# Patient Record
Sex: Male | Born: 1995 | Race: White | Hispanic: No | Marital: Single | Smoking: Never smoker
Health system: Southern US, Community
[De-identification: ages and names within clinical notes are randomized; demographics above are authoritative.]

---
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
## 2015-01-14 ENCOUNTER — Encounter: Payer: Self-pay | Admitting: Family Medicine

## 2015-01-14 ENCOUNTER — Ambulatory Visit (INDEPENDENT_AMBULATORY_CARE_PROVIDER_SITE_OTHER): Payer: 59 | Admitting: Family Medicine

## 2015-01-14 VITALS — BP 110/68 | HR 70 | Temp 98.3°F | Ht 74.0 in | Wt 188.0 lb

## 2015-01-14 DIAGNOSIS — Z0000 Encounter for general adult medical examination without abnormal findings: Secondary | ICD-10-CM | POA: Diagnosis not present

## 2015-01-14 LAB — HEPATIC FUNCTION PANEL
ALT: 32 U/L (ref 0–53)
AST: 32 U/L (ref 0–37)
Albumin: 4.4 g/dL (ref 3.5–5.2)
Alkaline Phosphatase: 38 U/L — ABNORMAL LOW (ref 52–171)
BILIRUBIN DIRECT: 0.1 mg/dL (ref 0.0–0.3)
Total Bilirubin: 0.4 mg/dL (ref 0.2–1.2)
Total Protein: 7.1 g/dL (ref 6.0–8.3)

## 2015-01-14 LAB — CBC WITH DIFFERENTIAL/PLATELET
BASOS ABS: 0 10*3/uL (ref 0.0–0.1)
Basophils Relative: 0.4 % (ref 0.0–3.0)
Eosinophils Absolute: 0.2 10*3/uL (ref 0.0–0.7)
Eosinophils Relative: 2.3 % (ref 0.0–5.0)
HEMATOCRIT: 40 % (ref 36.0–49.0)
HEMOGLOBIN: 13.3 g/dL (ref 12.0–16.0)
LYMPHS ABS: 1.9 10*3/uL (ref 0.7–4.0)
Lymphocytes Relative: 20.4 % — ABNORMAL LOW (ref 24.0–48.0)
MCHC: 33.2 g/dL (ref 31.0–37.0)
MCV: 94.7 fl (ref 78.0–98.0)
Monocytes Absolute: 0.7 10*3/uL (ref 0.1–1.0)
Monocytes Relative: 7.1 % (ref 3.0–12.0)
NEUTROS PCT: 69.8 % (ref 43.0–71.0)
Neutro Abs: 6.6 10*3/uL (ref 1.4–7.7)
PLATELETS: 155 10*3/uL (ref 150.0–575.0)
RBC: 4.22 Mil/uL (ref 3.80–5.70)
RDW: 12.5 % (ref 11.4–15.5)
WBC: 9.5 10*3/uL (ref 4.5–13.5)

## 2015-01-14 LAB — TSH: TSH: 1.71 u[IU]/mL (ref 0.40–5.00)

## 2015-01-14 LAB — BASIC METABOLIC PANEL
BUN: 27 mg/dL — ABNORMAL HIGH (ref 6–23)
CALCIUM: 10.1 mg/dL (ref 8.4–10.5)
CO2: 28 mEq/L (ref 19–32)
Chloride: 104 mEq/L (ref 96–112)
Creatinine, Ser: 0.9 mg/dL (ref 0.40–1.50)
GFR: 115.26 mL/min (ref >60.00)
Glucose, Bld: 71 mg/dL (ref 70–99)
POTASSIUM: 4.2 meq/L (ref 3.5–5.1)
SODIUM: 139 meq/L (ref 135–145)

## 2015-01-14 LAB — LIPID PANEL
Cholesterol: 129 mg/dL (ref 0–200)
HDL: 49.4 mg/dL (ref >39.00)
LDL CALC: 71 mg/dL (ref 0–99)
NonHDL: 79.6
TRIGLYCERIDES: 41 mg/dL (ref 0.0–149.0)
Total CHOL/HDL Ratio: 3
VLDL: 8.2 mg/dL (ref 0.0–40.0)

## 2015-01-14 NOTE — Progress Notes (Signed)
Pre visit review using our clinic review tool, if applicable. No additional management support is needed unless otherwise documented below in the visit note. 

## 2015-01-14 NOTE — Progress Notes (Signed)
   Subjective:    Patient ID: George Cowan, male    DOB: 06/12/1996, 19 y.o.   MRN: [id number]  HPI  Patient is here for well visit. He attends [employer]. He has taken some summer classes this summer and also is currently training for EMT certification. 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 Immunizations are up-to-date. He takes Clindagel but not consistently for acne. History of mild intermittent asthma and rarely takes albuterol.  Exercises regularly about 5 days per week. Mild weight change since high school. He states that his weight got down 171 pounds in the first semester of college but he's had some steady weight gain since then. Does combination of cardio and weight training about 5 days per week. Generally feels well with no complaints  No past medical history on file. No past surgical history on file.  reports that he has never smoked. He does not have any smokeless tobacco history on file. His alcohol and drug histories are not on file. family history is not on file. Allergies  Allergen Reactions  . Ampicillin     rash     Review of Systems  Constitutional: Negative for fever, activity change, appetite change and fatigue.  HENT: Negative for congestion, ear pain and trouble swallowing.   Eyes: Negative for pain and visual disturbance.  Respiratory: Negative for cough, shortness of breath and wheezing.   Cardiovascular: Negative for chest pain and palpitations.  Gastrointestinal: Negative for nausea, vomiting, abdominal pain, diarrhea, constipation, blood in stool, abdominal distention and rectal pain.  Genitourinary: Negative for dysuria, hematuria and testicular pain.  Musculoskeletal: Negative for joint swelling and arthralgias.  Skin: Negative for rash.  Neurological: Negative for dizziness, syncope and headaches.  Hematological: Negative for adenopathy.  Psychiatric/Behavioral: Negative for confusion and dysphoric mood.       Objective:   Physical Exam    Constitutional: He is oriented to person, place, and time. He appears well-developed and well-nourished. No distress.  HENT:  Head: Normocephalic and atraumatic.  Right Ear: External ear normal.  Left Ear: External ear normal.  Mouth/Throat: Oropharynx is clear and moist.  Eyes: Conjunctivae and EOM are normal. Pupils are equal, round, and reactive to light.  Neck: Normal range of motion. Neck supple. No thyromegaly present.  Cardiovascular: Normal rate, regular rhythm and normal heart sounds.   No murmur heard. Pulmonary/Chest: No respiratory distress. He has no wheezes. He has no rales.  Abdominal: Soft. Bowel sounds are normal. He exhibits no distension and no mass. There is no tenderness. There is no rebound and no guarding.  Musculoskeletal: He exhibits no edema.  Lymphadenopathy:    He has no cervical adenopathy.  Neurological: He is alert and oriented to person, place, and time. He displays normal reflexes. No cranial nerve deficit.  Skin: No rash noted.  Psychiatric: He has a normal mood and affect.          Assessment & Plan:  Complete physical. Healthy 19 year old male. Obtain screening labs. Immunizations up-to-date. Reminder for yearly flu vaccine.

## 2015-09-12 ENCOUNTER — Encounter: Payer: Self-pay | Admitting: Sports Medicine

## 2015-09-12 ENCOUNTER — Ambulatory Visit (INDEPENDENT_AMBULATORY_CARE_PROVIDER_SITE_OTHER): Payer: 59 | Admitting: Sports Medicine

## 2015-09-12 VITALS — BP 124/51 | HR 63 | Ht 74.0 in | Wt 190.0 lb

## 2015-09-12 DIAGNOSIS — M7541 Impingement syndrome of right shoulder: Secondary | ICD-10-CM

## 2015-09-12 DIAGNOSIS — M25561 Pain in right knee: Secondary | ICD-10-CM | POA: Diagnosis not present

## 2015-09-12 DIAGNOSIS — M25562 Pain in left knee: Secondary | ICD-10-CM

## 2015-09-12 NOTE — Assessment & Plan Note (Signed)
He has hypertrophy on the right side of his upper back as well as scapular protraction Scapular stabilization exercises standard rotator cuff exercises for the supraspinatous  Work on home exercise program If not improving in 6 weeks I will refer him to physical therapy

## 2015-09-12 NOTE — Assessment & Plan Note (Signed)
Today he has pain on the lateral right knee suggestive of iliotibial band syndrome particularly with his gluteus medius weakness His running gait appears normal  Left knee appears to have more patellofemoral symptoms but compression test and other tests are unremarkable weak on his gluteus medius testing  I suspect he gets mechanical problems with his knees fairly quickly because of his hip abduction weakness I suggest a series of hip abduction strengthening exercises Since his joints actually look good I think he should resume running in 3-4 weeks

## 2015-09-12 NOTE — Progress Notes (Signed)
Subjective:  HPI:  George Cowan is a 20 year old previously healthy male who presents with bilateral knee pain and right shoulder pain.  He runs and lifts weights regularly.    His pain in his left knee began approximately 3 months ago.  He states that it feels worse when he is running, especially with impact.  He does not have pain using an elliptical or stationary bike. Pain localizes to the anterior leg just below the patella, and he states that it is slightly tender to touch.  Pain does not radiate. He states that the pain has gotten progressively worse over time, and he has gradually stopped running.   His pain in his right knee began approximately 1 month ago. He states that it hurts lateral to the patella and is tender to touch.  The pain began insidiously and has also worsened over time.   Pain does not radiate.  Pain is worse when squatting.   His pain in his right shoulder began approximately 3 months ago. He states that he has tenderness on his shoulder (right below the acromion) as well as tenderness above his scapula.  Pain has gradually worsened over time.  Pain is worsened by abducting his right arm.  Of note, he was seen for pain in his right knee by Dr. Antoine Primas  on 08/04/2015.  At that time per the note, he had an ultrasound which showed thickening of the superior lateral aspect of the patella which was felt could increase the patient's risk for subluxation of the patella. He was recommended to ice the knee after activity and was given exercises to strengthen the musculature surrounding the patella. He returns today because pain has not improved.  Social history sophomore student at [employer]  Review of systems No locking giving way or swelling in either knee No nighttime pain in his right shoulder  Objective:  Physical exam:   General: Alert, pleasant and talkative. Well-appearing, muscular 20 yo male in no acute distress HEENT: Normocephalic, atraumatic.  Sclera white without injection. Moist mucus membranes Cardiac: normal S1 and S2. Regular rate and rhythm. No murmurs heard on auscultation. Pulmonary: normal work of breathing. Clear to auscultation bilaterally.  Abdomen: soft, nontender, nondistended Extremities: warm and well-perfused; brisk capillary refill Skin: small scabs and scrapes to bilateral anterior legs, no rashes Neuro: no focal deficits   MSK:  Left Knee: Normal to inspection without any erythema or bony abnormalities Non painful patellar compression. Mild point tenderness caudad to patella. Patellar glide without crepitus. Full range of motion of hip and knee. Good strength in quadriceps and hamstring muscles. Weakness on lateral leg raise Negative Mcmurray's test. Negative Lachman's test.    Right Knee:  Normal to inspection without any erythema or bony abnormalities Non painful patellar compression. Mild point tenderness lateral to patella. Patellar glide without crepitus. Full range of motion of hip and knee. Good strength in quadriceps and hamstring muscles. Weakness on lateral leg raise Negative Mcmurray's test. Negative Lachman's test.   Right Shoulder: Inspection revealed enlarged back and shoulder musculature on right compared to left. No erythema or bony abnormalities Point tenderness to palpation between acromion and humerus. Tenderness to palpation along cephalad edge of scapula.  Good biceps, triceps strength.  No pain of arm flexion or extension or adduction, but pain on abduction of arm beyond approx. 60 degrees. Positive empty can test. + Hawkins test.  Labs: Right Knee [place]: No abnormalities of medial or lateral meniscus noted. Normal suprapatellar anatomy. QT/  PT are normal Left Knee US: No abnormalities of medial or lateral meniscus noted. Normal suprapatellar anatomy. QT/PT are normal   Assessment: Wilber OliphantCaleb is a 20 year old previously healthy male who presents with bilateral knee pain and right  shoulder pain. Normal knee anatomy revealed by US.  Exam reveals bilaterally weakness on lateral leg raise consistent with weak gluteal medius muscles.  Exam revealed positive empty can test for right shoulder in addition to increased musculature of right back and shoulder compared to left, consistent with supraspinatus impingement.  Likely due to weight lifting without appropriate balance of exercises.   Plan:  Bilateral Knee pain: - Exercises recommended including lateral leg raises and lateral steps  Right Shoulder pain: Impingement syndrome - Exercises recommended.  -Return to clinic as needed  Glennon HamiltonAmber Marzetta Lanza, MD [facility] PGY-1 09/12/2015  Agree with assessment.  Examined and evaluated by me during course of visit.  Sterling BigKB Fields, MD

## 2015-09-18 ENCOUNTER — Encounter: Payer: Self-pay | Admitting: Family Medicine

## 2015-09-19 ENCOUNTER — Other Ambulatory Visit: Payer: Self-pay | Admitting: Family Medicine

## 2015-09-19 MED ORDER — CLINDAMYCIN PHOSPHATE 1 % EX GEL: Freq: Two times a day (BID) | CUTANEOUS | Status: AC

## 2016-01-16 DIAGNOSIS — M25511 Pain in right shoulder: Secondary | ICD-10-CM | POA: Diagnosis not present

## 2016-01-22 ENCOUNTER — Other Ambulatory Visit: Payer: Self-pay | Admitting: Orthopedic Surgery

## 2016-01-22 DIAGNOSIS — M25511 Pain in right shoulder: Secondary | ICD-10-CM

## 2016-01-24 DIAGNOSIS — R531 Weakness: Secondary | ICD-10-CM | POA: Diagnosis not present

## 2016-02-04 ENCOUNTER — Ambulatory Visit
Admission: RE | Admit: 2016-02-04 | Discharge: 2016-02-04 | Disposition: A | Discharge status: Home / Self Care | Payer: 59 | Source: Ambulatory Visit | Attending: Orthopedic Surgery | Admitting: Orthopedic Surgery

## 2016-02-04 DIAGNOSIS — M25511 Pain in right shoulder: Secondary | ICD-10-CM

## 2016-02-04 DIAGNOSIS — M7581 Other shoulder lesions, right shoulder: Secondary | ICD-10-CM | POA: Diagnosis not present

## 2016-02-04 MED ORDER — IOPAMIDOL (ISOVUE-M 200) INJECTION 41%: 15.0000 mL | Freq: Once | INTRAMUSCULAR | Status: DC

## 2016-02-17 DIAGNOSIS — M25511 Pain in right shoulder: Secondary | ICD-10-CM | POA: Diagnosis not present

## 2016-02-17 DIAGNOSIS — R531 Weakness: Secondary | ICD-10-CM | POA: Diagnosis not present

## 2016-04-02 DIAGNOSIS — Z23 Encounter for immunization: Secondary | ICD-10-CM | POA: Diagnosis not present

## 2016-05-18 ENCOUNTER — Ambulatory Visit (INDEPENDENT_AMBULATORY_CARE_PROVIDER_SITE_OTHER): Payer: Self-pay | Admitting: Orthopedic Surgery

## 2016-05-18 DIAGNOSIS — H52222 Regular astigmatism, left eye: Secondary | ICD-10-CM | POA: Diagnosis not present

## 2016-05-19 DIAGNOSIS — L812 Freckles: Secondary | ICD-10-CM | POA: Diagnosis not present

## 2016-05-19 DIAGNOSIS — L2081 Atopic neurodermatitis: Secondary | ICD-10-CM | POA: Diagnosis not present

## 2016-05-19 DIAGNOSIS — L700 Acne vulgaris: Secondary | ICD-10-CM | POA: Diagnosis not present

## 2018-05-06 IMAGING — MR MR SHOULDER*R* W/CM
6 series · 40 of 40 positions shown · IV contrast (agent unspecified)
Comparison: Injection image same date.

CLINICAL DATA: Right shoulder pain radiating into the scapular
region for 6 or 7 months. Weight lifter with hand weakness. No acute
injury or prior relevant surgery.

EXAM:
MR ARTHROGRAM OF THE RIGHT SHOULDER
TECHNIQUE: Multiplanar, multisequence MR imaging of the right shoulder was
performed following the administration of intra-articular contrast.
CONTRAST:  See Injection Documentation.

[Series 3: T1 fat-sat · axial · 4.0mm · 0.23mm/px · z∈[-32,+63]mm · 8 of 21 slices shown (1 of 4)]
[im 1/21]
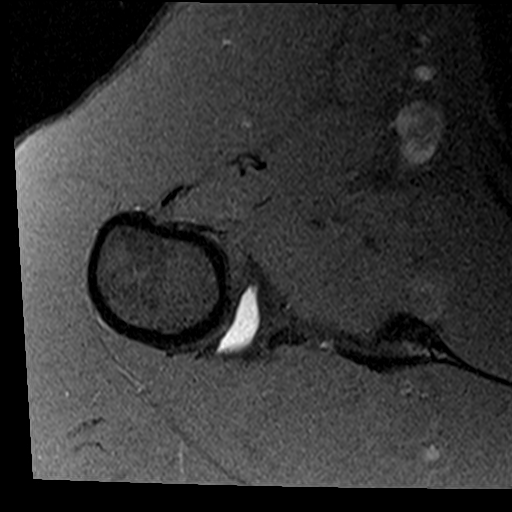
[im 3/21]
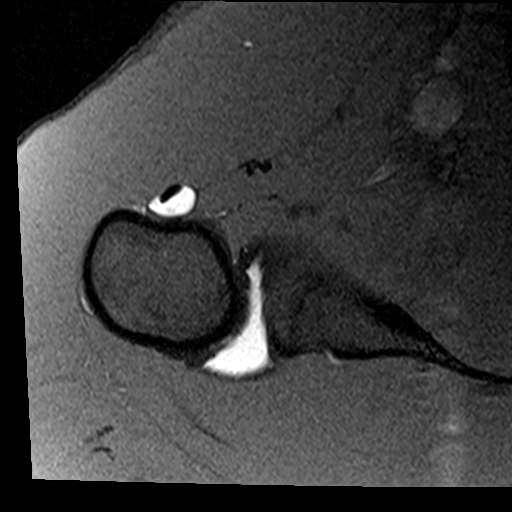
[im 6/21]
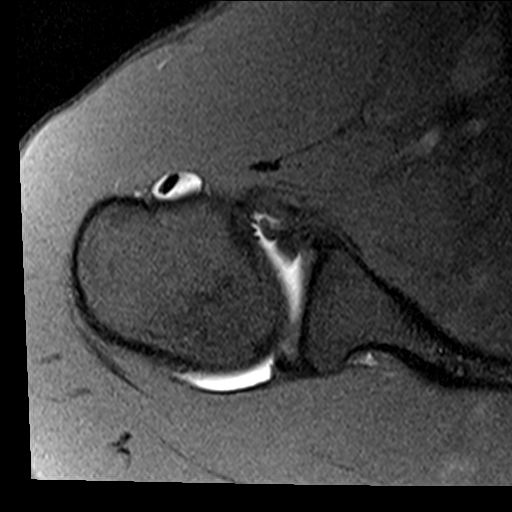
[im 9/21]
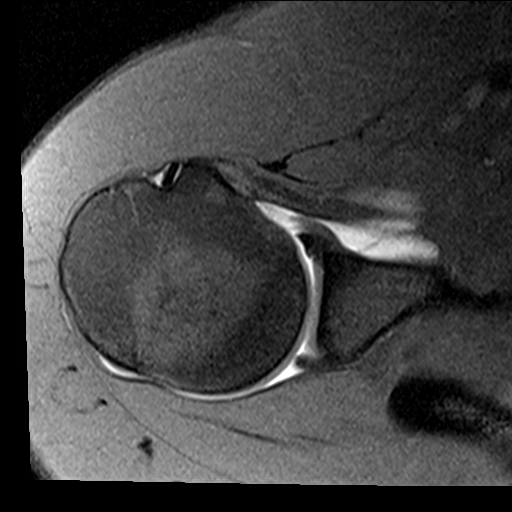
[im 12/21]
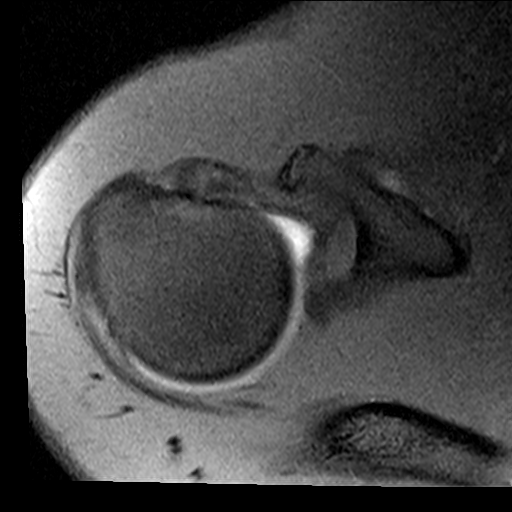
[im 15/21]
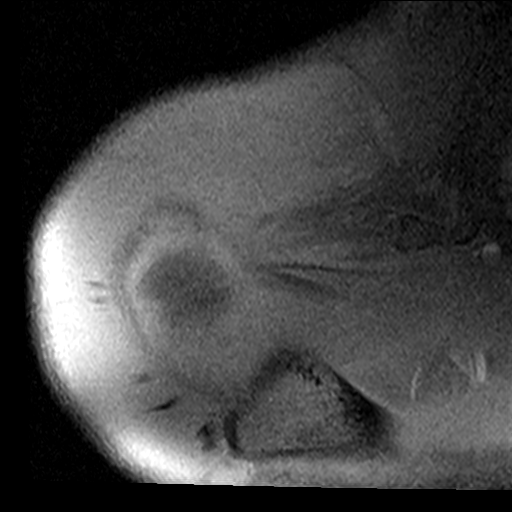
[im 18/21]
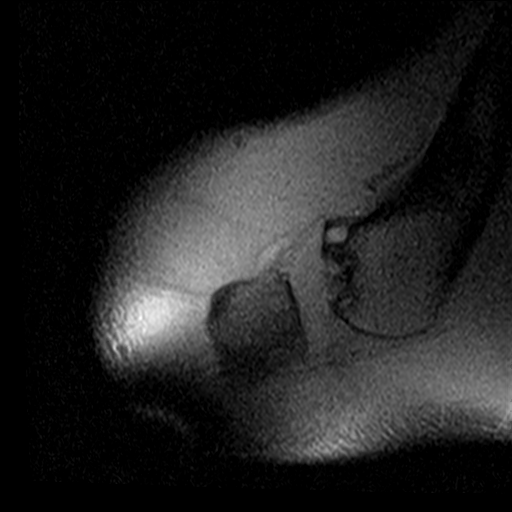
[im 21/21]
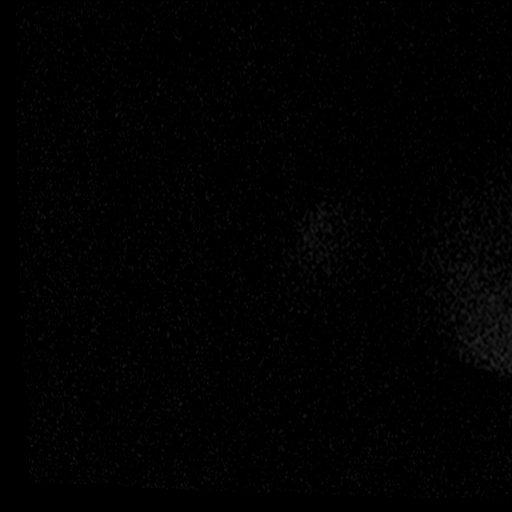

[Series 4: T2 fat-sat · oblique · 4.0mm · 0.55mm/px · 8 of 21 slices shown (1 of 2)]
[im 1/21]
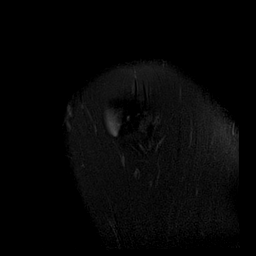
[im 3/21]
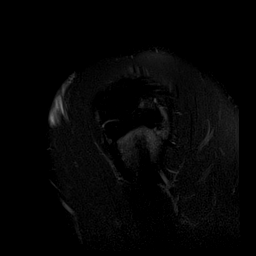
[im 6/21]
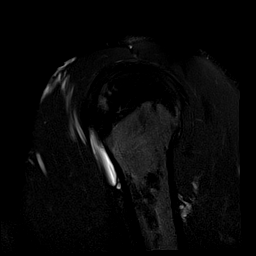
[im 9/21]
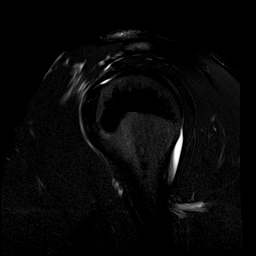
[im 12/21]
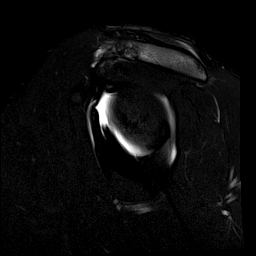
[im 15/21]
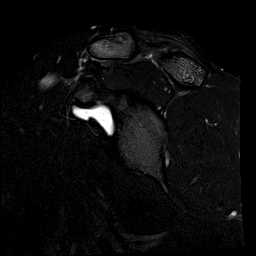
[im 18/21]
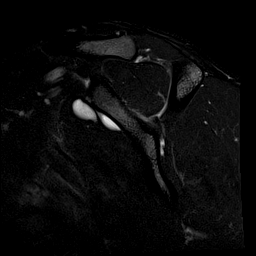
[im 21/21]
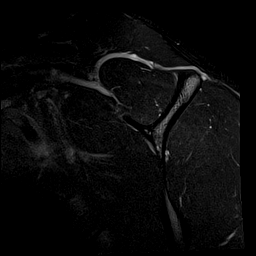

[Series 5: T1 fat-sat · oblique · 4.0mm · 0.44mm/px · 6 of 19 slices shown (2 of 4)]
[im 1/19]
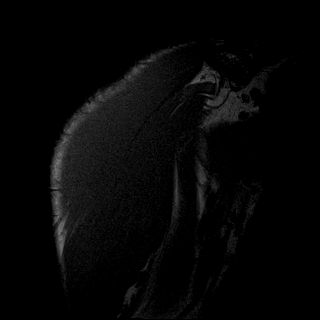
[im 4/19]
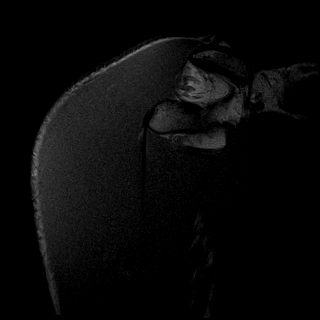
[im 8/19]
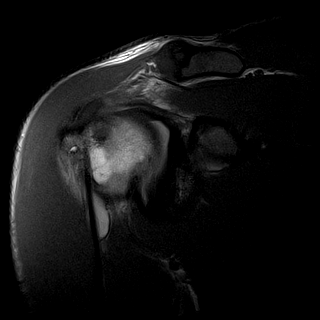
[im 11/19]
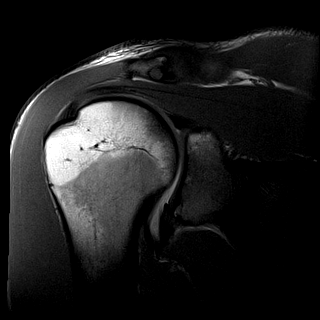
[im 15/19]
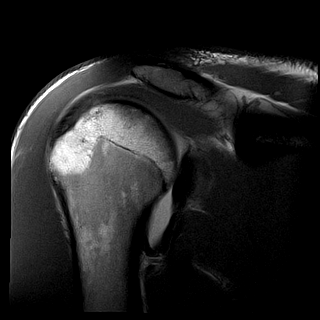
[im 19/19]
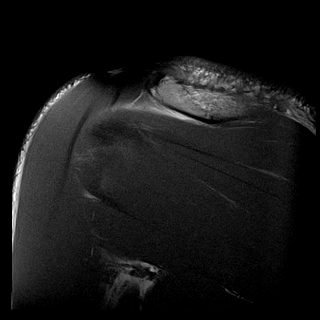

[Series 6: T1 fat-sat · oblique · 4.0mm · 0.55mm/px · 6 of 19 slices shown (3 of 4)]
[im 1/19]
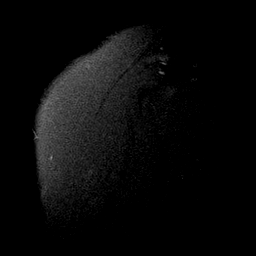
[im 4/19]
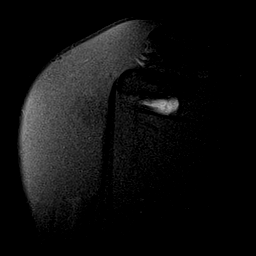
[im 8/19]
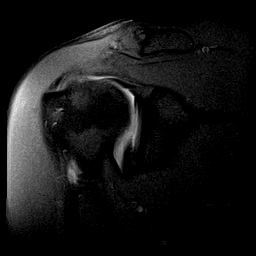
[im 11/19]
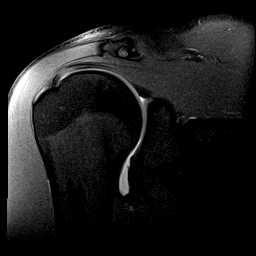
[im 15/19]
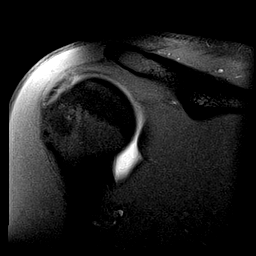
[im 19/19]
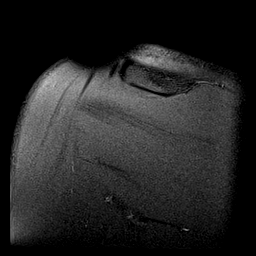

[Series 7: T2 fat-sat · oblique · 4.0mm · 0.55mm/px · 6 of 19 slices shown (2 of 2)]
[im 1/19]
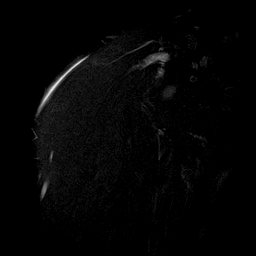
[im 4/19]
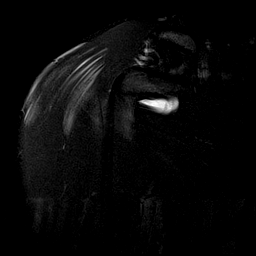
[im 8/19]
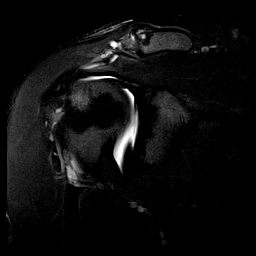
[im 11/19]
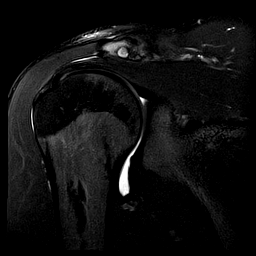
[im 15/19]
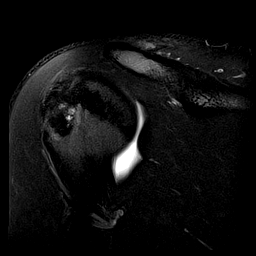
[im 19/19]
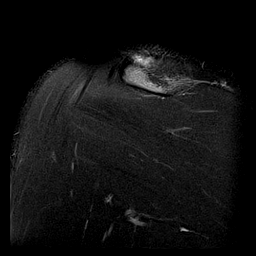

[Series 12: T1 fat-sat · sagittal · 4.0mm · 0.59mm/px · 6 of 18 slices shown (4 of 4)]
[im 1/18]
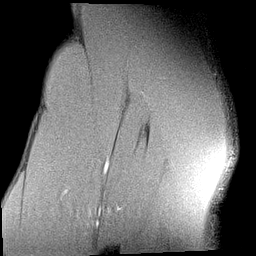
[im 4/18]
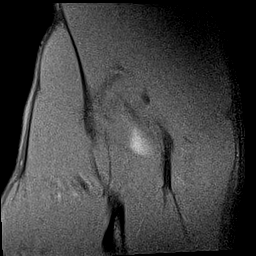
[im 7/18]
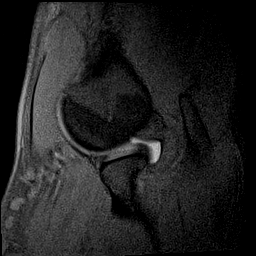
[im 11/18]
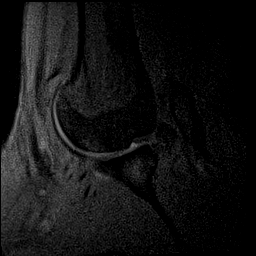
[im 14/18]
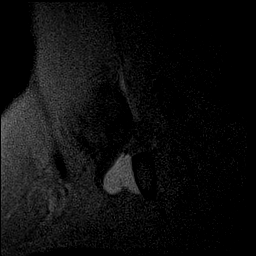
[im 18/18]
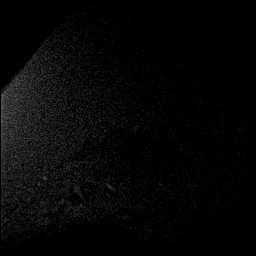

[40 of 40 positions shown; findings below may reference images not displayed]

FINDINGS: Rotator cuff: Intact. There is mild infraspinatus tendinosis with
mild cyst formation posteriorly in the humeral head near the
infraspinatus insertion. The supraspinatus, subscapularis and teres
minor tendons appear normal.

Muscles:  No focal muscular atrophy or edema.

Biceps long head:  Intact and normally positioned.

Acromioclavicular Joint: The acromion is type 1. The
acromioclavicular joint is widened to 8 mm. There is prominent bone
marrow edema and cyst formation within the distal clavicle and
adjacent acromion. There is a small amount of fluid in the
subacromial -subdeltoid bursa. The coracoclavicular ligaments appear
intact.

Glenohumeral Joint: The shoulder joint appears adequately distended
with contrast. No intra-articular loose body or glenohumeral
arthropathy seen.

Labrum:  No evidence of labral tear.

Bones: As above, of widening of the acromioclavicular joint with
adjacent bone marrow edema and cyst formation. No other significant
extra-articular osseous findings.

Other: No significant soft tissue findings.
IMPRESSION: 1. The primary abnormality is widening of the acromioclavicular
joint with associated intraosseous edema and cyst formation,
probably due to old AC joint separation injury.
2. The rotator cuff appears intact with mild infraspinatus
tendinosis.
3. No evidence of labral or biceps tendon tear.
